# Patient Record
Sex: Male | Born: 1960 | Race: White | Hispanic: No | Marital: Single
Health system: Southern US, Community
[De-identification: ages and names within clinical notes are randomized; demographics above are authoritative.]

---

## 2004-03-30 ENCOUNTER — Ambulatory Visit (HOSPITAL_BASED_OUTPATIENT_CLINIC_OR_DEPARTMENT_OTHER): Admission: RE | Admit: 2004-03-30 | Discharge: 2004-03-30 | Payer: Self-pay | Admitting: Orthopedic Surgery

## 2005-01-30 ENCOUNTER — Emergency Department (HOSPITAL_COMMUNITY): Admission: EM | Admit: 2005-01-30 | Discharge: 2005-01-30 | Payer: Self-pay | Admitting: Emergency Medicine

## 2006-10-15 ENCOUNTER — Encounter: Admission: RE | Admit: 2006-10-15 | Discharge: 2006-10-15 | Payer: Self-pay | Admitting: Family Medicine

## 2006-11-19 ENCOUNTER — Emergency Department (HOSPITAL_COMMUNITY): Admission: EM | Admit: 2006-11-19 | Discharge: 2006-11-20 | Payer: Self-pay | Admitting: Emergency Medicine

## 2006-12-07 ENCOUNTER — Ambulatory Visit (HOSPITAL_COMMUNITY): Admission: RE | Admit: 2006-12-07 | Discharge: 2006-12-07 | Payer: Self-pay | Admitting: Orthopedic Surgery

## 2006-12-07 ENCOUNTER — Encounter: Payer: Self-pay | Admitting: Vascular Surgery

## 2006-12-07 ENCOUNTER — Ambulatory Visit: Payer: Self-pay | Admitting: Vascular Surgery

## 2006-12-27 ENCOUNTER — Ambulatory Visit (HOSPITAL_COMMUNITY): Admission: RE | Admit: 2006-12-27 | Discharge: 2006-12-27 | Payer: Self-pay | Admitting: Neurology

## 2007-02-21 ENCOUNTER — Ambulatory Visit (HOSPITAL_COMMUNITY): Admission: RE | Admit: 2007-02-21 | Discharge: 2007-02-21 | Payer: Self-pay | Admitting: *Deleted

## 2007-08-22 ENCOUNTER — Emergency Department (HOSPITAL_COMMUNITY): Admission: EM | Admit: 2007-08-22 | Discharge: 2007-08-22 | Payer: Self-pay | Admitting: Emergency Medicine

## 2010-12-12 NOTE — Cardiovascular Report (Signed)
Jerry Klein, Jerry Klein              ACCOUNT NO.:  [id number]   MEDICAL RECORD NO.:  [id number]          PATIENT TYPE:  OIB   LOCATION:  2899                         FACILITY:  [facility]   PHYSICIAN:  Darlin Priestly, MD  DATE OF BIRTH:  01-24-1961   DATE OF PROCEDURE:  02/21/2007  DATE OF DISCHARGE:                            CARDIAC CATHETERIZATION   PROCEDURE:  Heads-up tilt-table testing with Isuprel infusion.   ATTENDING PHYSICIAN:  Dr. Lenise Herald.   COMPLICATIONS:  None.   INDICATIONS FOR PROCEDURE:  [name] is a 50 year old male patient of  Dr. Lavonne Chick and Dr. Doran Clay as well as Dr. Amelia Jo with a  history of Down's syndrome who was referred to Dr. Elsie Lincoln for recurrent  pre-syncope/syncope and collapse.  He has undergone MRI revealing a  small vessel disease, but no further CNS abnormalities that we are aware  of.  Apparently his EEG was normal.  He is now referred for heads-up  tilt-table and to rule out possible neurocardiogenic etiology.   DESCRIPTION OF PROCEDURE:  After obtaining informed written consent from  Ignacia Marvel, [name]' mother, the patient is brought to the cardiac  cath lab in the  fasting state and placed in the supine position.  Hemodynamic measures are obtained.  A resting blood pressure of 134/86,  resting heart rate of 86.  He is monitored for approximately five  minutes.  He was then tilted to a 7-degree heads-up position where he  was maintained for 30 minutes.  He remained hemodynamically stable with  no symptoms, no significant changes in his heart rate or blood pressure.  Isuprel infusion was then begun at 0.5 mg and this was continued for  approximately five minutes.  He did increase in his heart rate up to  127, however, he had no significant change in his symptoms or blood  pressure.  His target heart rate was 96, which was obviously exceeded.  He remained asymptomatic.  Isuprel was then discontinued and he was  returned  back to a supine position.  He was monitored for approximately  five minutes and transferred back to the recovery room in stable  condition.   CONCLUSION:  Negative heads-up tilt-table testing with Isuprel infusion.      Darlin Priestly, MD  Electronically Signed     RHM/MEDQ  D:  02/21/2007  T:  02/21/2007  Job:  [id number]   cc:   Madaline Savage, M.D.  Al Decant. Janey Greaser, MD  Rene Kocher, M.D.

## 2010-12-12 NOTE — Procedures (Signed)
CLINICAL INFORMATION:  This is a 50 year old patient with Down syndrome  who complains of severe headaches, weakness and dizziness.   TECHNICAL DESCRIPTION:  This EEG was obtained during the awake state.  There is much artifact throughout this study including muscle artifact,  eye movement artifact, and eye rolling artifact.  The background  activity when able to be read was in the 7-8 Hz range.  No evidence of  stage II sleep was present but drowsiness was noted.  Photic stimulation  was performed which did not produce a drying response.  Hyperventilation  testing was not performed.   IMPRESSION:  This is a mildly abnormal EEG.  There is much artifact but  background activity does show some slowing into the 7-8 Hz range.  No  focal abnormalities are noted and this finding should be taken into  account of the clinical study of the patient and would be compatible to  toxic metabolic disorder, primary neuronal degenerative disorder,  medication side-effect, postictal state, etc. and should be taken  account this period at that point.           ______________________________  Genene Churn. Sandria Manly, M.D.     ZOX:WRUE  D:  12/27/2006 19:30:46  T:  12/28/2006 08:07:50  Job #:  [id number]   cc:   Rene Kocher, M.D.  Fax: [phone]

## 2010-12-15 NOTE — Op Note (Signed)
NAMEJETER, TOMEY                          ACCOUNT NO.:  [id number]   MEDICAL RECORD NO.:  [id number]                   PATIENT TYPE:  AMB   LOCATION:  DSC                                  FACILITY:  [facility]   PHYSICIAN:  Rodney A. Chaney Malling, M.D.           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   PROCEDURE:  1.  Dome osteotomy base of the first metatarsal, left foot.  2.  Release adductor tendon, great toe, left toe; exostosis, ________ to      great toe metacarpophalangeal joint and capsular advancement on medial      side of great toe.   SURGEON:  Lenard Galloway. Chaney Malling, M.D.   ANESTHESIA:  Local, followed by general.   DESCRIPTION OF PROCEDURE:  The patient was placed on the operating table in  the supine position with the pneumatic tourniquet about the left upper  thigh.  The left upper extremity was prepped with DuraPrep and draped out in  the usual manner.  The leg was wrapped up in an Esmarch and tourniquet was  elevated.  An incision was made over the exostosis over the great toe MP  joint.  Skin edges were retracted.  Great care was taken to avoid injury to  the superficial cutaneous nerves.  A densely based flap of the capsule was  incised and reflected distally.  This exposed a huge exostosis over the  medial border of the distal first metatarsal.  Using a power saw, the  exostosis was removed and saved for bone grafting later.  The capsule was  short and solid.  The toe was then realigned and the adductor tendon was  quite tight.  Incision was made between the first and second web space and  the adductor tendon released.  This allowed the toe to realign much better.  The  capsule of the medial border of the distal metatarsal was then closed  with 2-0 Vicryl and this realigned the great toe in a neutral position.  This was markedly improved.   Attention was turned to the base of the first metatarsal.  Incision was made  over the dorsal aspect.  Using the C-arm, the first metatarsal cuneiform  joint was identified and isolated.  Using a dome osteotomy saw, an osteotomy  was made through the base of the first metatarsal in the metaphyseal area.  Once this was accomplished, the severe metatarsus varus was realigned so the  first and second metatarsus were parallel.  In its new position, a heavy  ________ was passed through the metatarsal across the first cuneiform.  X-  rays were taken and it showed excellent realignment of the  first and second  metatarsal and these not parallel.  The bone graft which had been harvested  previously was placed around the osteotomy site.  The skin edges were then  closed with 3-0 nylon sutures.  Sterile dressings were applied and the  patient returned to the recovery room in excellent condition.  The procedure  technically went extremely well.  Drains none.  Complications none.  I was  very pleased with the technical result.                                               Rodney A. Chaney Malling, M.D.    RAM/MEDQ  D:  03/30/2004  T:  03/31/2004  Job:  [id number]

## 2011-03-17 ENCOUNTER — Emergency Department (HOSPITAL_COMMUNITY): Payer: Medicare Other

## 2011-03-17 ENCOUNTER — Emergency Department (HOSPITAL_COMMUNITY)
Admission: EM | Admit: 2011-03-17 | Discharge: 2011-03-17 | Disposition: A | Discharge status: Home / Self Care | Payer: Medicare Other | Attending: Emergency Medicine | Admitting: Emergency Medicine

## 2011-03-17 DIAGNOSIS — R569 Unspecified convulsions: Secondary | ICD-10-CM | POA: Insufficient documentation

## 2011-03-17 DIAGNOSIS — G309 Alzheimer's disease, unspecified: Secondary | ICD-10-CM | POA: Insufficient documentation

## 2011-03-17 DIAGNOSIS — Q909 Down syndrome, unspecified: Secondary | ICD-10-CM | POA: Insufficient documentation

## 2011-03-17 DIAGNOSIS — E039 Hypothyroidism, unspecified: Secondary | ICD-10-CM | POA: Insufficient documentation

## 2011-03-17 DIAGNOSIS — R404 Transient alteration of awareness: Secondary | ICD-10-CM | POA: Insufficient documentation

## 2011-03-17 DIAGNOSIS — F0280 Dementia in other diseases classified elsewhere without behavioral disturbance: Secondary | ICD-10-CM | POA: Insufficient documentation

## 2011-03-17 LAB — COMPREHENSIVE METABOLIC PANEL
ALT: 21 U/L (ref 0–53)
Albumin: 3.4 g/dL — ABNORMAL LOW (ref 3.5–5.2)
Alkaline Phosphatase: 91 U/L (ref 39–117)
CO2: 30 mEq/L (ref 19–32)
Calcium: 9.1 mg/dL (ref 8.4–10.5)
Creatinine, Ser: 1.14 mg/dL (ref 0.50–1.35)
GFR calc Af Amer: >60 mL/min (ref >60)
Glucose, Bld: 113 mg/dL — ABNORMAL HIGH (ref 70–99)
Total Bilirubin: 0.3 mg/dL (ref 0.3–1.2)
Total Protein: 7.2 g/dL (ref 6.0–8.3)

## 2011-03-17 LAB — CBC
MCV: 92.3 fL (ref 78.0–100.0)
RBC: 5.3 MIL/uL (ref 4.22–5.81)
RDW: 14.8 % (ref 11.5–15.5)
WBC: 7.3 10*3/uL (ref 4.0–10.5)

## 2011-03-17 LAB — DIFFERENTIAL
Basophils Absolute: 0 10*3/uL (ref 0.0–0.1)
Eosinophils Relative: 1 % (ref 0–5)
Lymphocytes Relative: 15 % (ref 12–46)
Monocytes Absolute: 0.4 10*3/uL (ref 0.1–1.0)

## 2011-04-19 LAB — URINALYSIS, ROUTINE W REFLEX MICROSCOPIC
Ketones, ur: NEGATIVE
Protein, ur: NEGATIVE

## 2011-04-19 LAB — COMPREHENSIVE METABOLIC PANEL
AST: 29
Albumin: 3.7
Alkaline Phosphatase: 68
CO2: 27
Calcium: 8.9
Chloride: 105
GFR calc Af Amer: >60
Potassium: 3.8
Total Bilirubin: 0.8

## 2011-04-19 LAB — CBC
Hemoglobin: 15.7
RBC: 4.81
WBC: 4.6

## 2011-04-19 LAB — TSH: TSH: 0.995

## 2011-04-19 LAB — DIFFERENTIAL
Basophils Relative: 1
Eosinophils Absolute: 0.1

## 2011-04-19 LAB — ACETAMINOPHEN LEVEL: Acetaminophen (Tylenol), Serum: <10 — ABNORMAL LOW

## 2011-04-19 LAB — RAPID URINE DRUG SCREEN, HOSP PERFORMED
Amphetamines: NOT DETECTED
Barbiturates: NOT DETECTED
Benzodiazepines: NOT DETECTED
Tetrahydrocannabinol: NOT DETECTED

## 2011-04-19 LAB — PROTIME-INR: INR: 0.9

## 2011-04-29 ENCOUNTER — Emergency Department (HOSPITAL_COMMUNITY): Payer: Medicare Other

## 2011-04-29 ENCOUNTER — Inpatient Hospital Stay (HOSPITAL_COMMUNITY)
Admission: EM | Admit: 2011-04-29 | Discharge: 2011-05-01 | DRG: 101 | Disposition: A | Discharge status: Home / Self Care | Payer: Medicare Other | Attending: Internal Medicine | Admitting: Internal Medicine

## 2011-04-29 ENCOUNTER — Emergency Department (HOSPITAL_COMMUNITY)
Admission: EM | Admit: 2011-04-29 | Discharge: 2011-04-29 | Disposition: A | Discharge status: Home / Self Care | Payer: Medicare Other | Attending: Emergency Medicine | Admitting: Emergency Medicine

## 2011-04-29 DIAGNOSIS — G20A1 Parkinson's disease without dyskinesia, without mention of fluctuations: Secondary | ICD-10-CM | POA: Diagnosis present

## 2011-04-29 DIAGNOSIS — G20 Parkinson's disease: Secondary | ICD-10-CM | POA: Diagnosis present

## 2011-04-29 DIAGNOSIS — Q909 Down syndrome, unspecified: Secondary | ICD-10-CM | POA: Insufficient documentation

## 2011-04-29 DIAGNOSIS — Z79899 Other long term (current) drug therapy: Secondary | ICD-10-CM

## 2011-04-29 DIAGNOSIS — E039 Hypothyroidism, unspecified: Secondary | ICD-10-CM | POA: Insufficient documentation

## 2011-04-29 DIAGNOSIS — R569 Unspecified convulsions: Secondary | ICD-10-CM | POA: Insufficient documentation

## 2011-04-29 DIAGNOSIS — G309 Alzheimer's disease, unspecified: Secondary | ICD-10-CM | POA: Diagnosis present

## 2011-04-29 DIAGNOSIS — M5030 Other cervical disc degeneration, unspecified cervical region: Secondary | ICD-10-CM | POA: Insufficient documentation

## 2011-04-29 DIAGNOSIS — F0280 Dementia in other diseases classified elsewhere without behavioral disturbance: Secondary | ICD-10-CM | POA: Diagnosis present

## 2011-04-29 DIAGNOSIS — M542 Cervicalgia: Secondary | ICD-10-CM | POA: Insufficient documentation

## 2011-04-29 DIAGNOSIS — G40909 Epilepsy, unspecified, not intractable, without status epilepticus: Principal | ICD-10-CM | POA: Diagnosis present

## 2011-04-29 DIAGNOSIS — G4700 Insomnia, unspecified: Secondary | ICD-10-CM | POA: Diagnosis present

## 2011-04-29 LAB — DIFFERENTIAL
Basophils Absolute: 0 10*3/uL (ref 0.0–0.1)
Eosinophils Absolute: 0.1 10*3/uL (ref 0.0–0.7)
Eosinophils Relative: 2 % (ref 0–5)
Lymphocytes Relative: 26 % (ref 12–46)
Lymphs Abs: 2 10*3/uL (ref 0.7–4.0)
Lymphs Abs: 1.5 10*3/uL (ref 0.7–4.0)
Monocytes Absolute: 0.8 10*3/uL (ref 0.1–1.0)
Neutro Abs: 3.9 10*3/uL (ref 1.7–7.7)
Neutrophils Relative %: 61 % (ref 43–77)

## 2011-04-29 LAB — CBC
HCT: 43.5 % (ref 39.0–52.0)
Hemoglobin: 15.2 g/dL (ref 13.0–17.0)
MCH: 31.7 pg (ref 26.0–34.0)
MCHC: 33.5 g/dL (ref 30.0–36.0)
MCV: 91.8 fL (ref 78.0–100.0)
RBC: 4.74 MIL/uL (ref 4.22–5.81)
WBC: 6 10*3/uL (ref 4.0–10.5)

## 2011-04-29 LAB — COMPREHENSIVE METABOLIC PANEL
BUN: 17 mg/dL (ref 6–23)
Calcium: 8.9 mg/dL (ref 8.4–10.5)
GFR calc Af Amer: >60 mL/min (ref >60)
Glucose, Bld: 97 mg/dL (ref 70–99)
Sodium: 141 mEq/L (ref 135–145)
Total Protein: 7 g/dL (ref 6.0–8.3)

## 2011-04-29 LAB — BASIC METABOLIC PANEL
BUN: 14 mg/dL (ref 6–23)
CO2: 23 mEq/L (ref 19–32)
Chloride: 106 mEq/L (ref 96–112)
Creatinine, Ser: 0.94 mg/dL (ref 0.50–1.35)

## 2011-04-29 NOTE — H&P (Signed)
NAMEPREM, COYKENDALL NO.:  [id number]  MEDICAL RECORD NO.:  [id number]  LOCATION:  [facility]                         FACILITY:  [facility]  PHYSICIAN:  Andreas Blower, MD       DATE OF BIRTH:  [birth year]  DATE OF ADMISSION:  04/29/2011 DATE OF DISCHARGE:                             HISTORY & PHYSICAL   PRIMARY CARE PHYSICIAN:  Thayer Headings, MD  PRIMARY NEUROLOGIST:  Joycelyn Schmid, MD  CHIEF COMPLAINT:  Seizure-like activity.  HISTORY OF PRESENT ILLNESS:  [name] is a 50 year old Caucasian male with a history of Down syndrome, hypothyroidism, Alzheimer's disease who presents with the above complaints.  The patient is not able to provide history.  The patient's brother was able to provide most of the history. The patient has had these episodes of small seizures what the brother described as jerking-like movements for about 4-5 seconds.  The patient in the process had been seeing Dr. Marjory Lies with Neurology and had a EEG done on April 26, 2011, the results are still pending.  However, yesterday, April 28, 2011, the patient had an episode of seizure with decreased mentation, as a result presented to the [facility] yesterday.  The patient was evaluated by Dr. Lendell Caprice with Neurology, who recommended sublingual ativan and having the patient followup with Dr. Marjory Lies with Neurology as an outpatient.  After being discharged this morning from the emergency department. Around 12:30, the patient had another episode of seizure.  This one lasting for a few minutes and had another seizure at 1:24 p.m. with labored breathing that lasted for 7-18 minutes with decreased responsiveness, as a result was brought into the ER for further evaluation by the EMS.  Per family, the patient has not had any recent fevers, chills, nausea, or vomiting, has not had any chest pain or shortness of breath, has not had any abdominal pain, diarrhea, headaches, or vision  changes.  REVIEW OF SYSTEMS:  All systems were reviewed with the patient was positive as per HPI, otherwise all other systems are negative.  PAST MEDICAL HISTORY: 1. History of Alzheimer's disease. 2. History of arthritis. 3. History of Down syndrome 4. Hypothyroidism. 5. Questionable history of Parkinson's disease. 6. History of insomnia, was on Lunesta which has been held for the     last 3 days.  SOCIAL HISTORY:  The patient lives with his brother and mother.  Does not drink any alcohol and does not smoke.  FAMILY HISTORY:  Mother has diabetes and grandparents have diabetes.  HOME MEDICATIONS: 1. Levothyroxine 50 mcg p.o. q.a.m. 2. Lunesta 3 mg p.o. daily at bedtime.  Has not been taking any of these medications over the last 3 days.  PHYSICAL EXAM:  VITAL SIGNS:  Temperature is afebrile, blood pressure is 158/103, heart rate 89, respirations 18, satting at 98% on 3 liters of oxygen. GENERAL:  The patient was awake, no acute distress.  Answered simple questions and family answered more complicated questions.  Was lying in bed comfortably. HEENT:  Extraocular motions are intact.  Pupils equal and round and moist mucous membranes. NECK:  Supple. HEART:  Regular with S1 and S2. LUNGS:  Clear to auscultation bilaterally. ABDOMEN:  Soft, nontender, nondistended.  Positive bowel sounds. NEURO:  Cranial nerves grossly intact.  Had 5/5 motor strength in upper as well as lower extremities. NEUROLOGIC:  Limited due to the patient's compliance.  RADIOLOGY/IMAGING:  The patient had CT of the C-spine, which showed exam degraded by motion artifact.  No definite acute fracture was identified. Cervical kyphosis centered at old mild wedge compression deformity of C5 vertebral body.  Degenerative cervical spondylosis.  LABORATORY DATA:  CBC shows white count of 6.0, hemoglobin 14.8, hematocrit 43.5, platelet count 203.  Electrolytes normal with a BUN of 14, creatinine 0.94.  Liver  function tests normal except albumin 3.2.  ASSESSMENT/PLAN: 1. Seizure.  Etiology unclear at this time.  Uncertain if the patient     is truly having seizures versus myoclonic jerks. Neurology has been     consulted by the ER physician.  Dr. Hoy Morn with Neurology has been     notified.  Currently, the patient is started on empiric Keppra.  We     will defer to Neurology whether they would like to get an MRI of     his brain.  We will also defer to Neurology whether another EEG     should be done, as per family EEG has been done most recently on     April 26, 2011, with results unavilable to me.  We will get     a head CT to evaluate for any acute intracranial process that     could account for his seizure-like activity. 2. Hypothyroidism.  Continue the patient on Synthroid. 3. History of old wedge compression deformity of C5 vertebral body.     The patient does not have any significant back pain conservative     management for now. 4. History of Down syndrome, stable. 5. History of Alzheimer's disease.  Further management as per     outpatient. 6. Insomnia.  Hold Lunesta.  Monitor for now. 7. Prophylaxis.  Lovenox for DVT prophylaxis. 8. Code status.  The patient is full code.  The patient's brother     indicated that he is not willing to make any decisions at this     time until he talks to his mother.  Will have the rounding     physician address this in the morning.  Time spent on admission talking to the patient and family including care was 50 minutes.   Andreas Blower, MD   SR/MEDQ  D:  04/29/2011  T:  04/29/2011  Job:  [id number]  Electronically Signed by Wardell Heath Lindi Abram  on 04/29/2011 05:41:21 PM

## 2011-04-30 ENCOUNTER — Inpatient Hospital Stay (HOSPITAL_COMMUNITY): Payer: Medicare Other

## 2011-04-30 LAB — BASIC METABOLIC PANEL
BUN: 11 mg/dL (ref 6–23)
Chloride: 104 mEq/L (ref 96–112)
Creatinine, Ser: 1.01 mg/dL (ref 0.50–1.35)
GFR calc Af Amer: >90 mL/min (ref >90)
Glucose, Bld: 96 mg/dL (ref 70–99)

## 2011-04-30 LAB — CBC
HCT: 41.7 % (ref 39.0–52.0)
MCHC: 33.1 g/dL (ref 30.0–36.0)
MCV: 93.3 fL (ref 78.0–100.0)
RDW: 15.2 % (ref 11.5–15.5)
WBC: 7.7 10*3/uL (ref 4.0–10.5)

## 2011-04-30 NOTE — Consult Note (Signed)
Jerry Klein, Jerry Klein NO.:  [id number]  MEDICAL RECORD NO.:  [id number]  LOCATION:  [facility]                         FACILITY:  [facility]  PHYSICIAN:  Kipp Laurence, MD DATE OF BIRTH:  1961/04/26  DATE OF CONSULTATION:  04/29/2011 DATE OF DISCHARGE:  04/29/2011                                CONSULTATION   Time of Consultation:  4:45 a.m.  CONSULTING PHYSICIAN:  Dr. Kennon Rounds, MD.  CHIEF COMPLAINT:  Jerking movements.  HISTORY OF PRESENT ILLNESS:  Jerry Klein is a 50 year old white man with history of Down syndrome, Alzheimer's disease, Parkinson's disease, and a new-onset generalized tonic-clonic seizure in August of 2012, who presents to the emergency department with worsening of his recent jerking episodes.  He is accompanied by his mother and brother, who provide all of his history.  His mom reports that for the past 6 to 12 months the patient has developed episodes of full body jerking lasting seconds at a time and often in isolation.  These episodes frequently cluster primarily in the morning time, although they can occur at any time during the day.  During these, he has no change in his mental status and is able to remain alert.  If they occur while he is walking, he will briefly trip but has never fallen with them. The patient walks with assistance at baseline.  These episodes have increased in frequency since they began 6 to 12 months ago and now occur almost every day multiple times throughout the day.  The patient presented in August of this year with a first time generalized tonic- clonic seizure and underwent a CT of his head at that time at [facility] which was unrevealing.  Since then, he has been evaluated outpatient with Dr. Marjory Lies at [facility]  Neurology for evaluation of these jerking episodes.  The patient underwent an EEG at Dr. Richrd Humbles office earlier this week; however, family is not aware of the results yet and has not seen  him yet in followup.  The patient is not currently nor has he been initiated on antiepileptic therapy and he has had no further generalized tonic-clonic events.  His mother reports he has no history of seizures even at the young age.  She denies any recent medication changes, toxic exposures, or head or neck trauma.  Mother does report that the patient has severe nighttime snoring and intermittent episodes of apnea while sleeping that are concerning for undiagnosed obstructive sleep apnea.  PAST MEDICAL HISTORY: 1. Down syndrome. 2. Alzheimer's disease. 3. Hypothyroidism. 4. Parkinson's disease.  MEDICATIONS: 1. Synthroid unknown dose daily. 2. Lunesta nightly as needed.  ALLERGIES:  ARICEPT, unknown reaction.  SOCIAL HISTORY:  The patient lives with his mother and is reliant upon her for all of his ADLs.  He has no tobacco, alcohol, or drug use history.  FAMILY HISTORY:  No family history of seizures or neurologic disease.  REVIEW OF SYSTEMS:  Review of systems was unable to be obtained from the patient secondary to his baseline mental status; however, his mother and brother report he has had no other recent symptoms except those as noted in the HPI.  PHYSICAL EXAMINATION:  VITAL SIGNS:  Blood  pressure 100/73, heart rate 49, respiratory rate 16, pulse ox 96%. GENERAL:  No acute distress.  The patient is pleasant and lying in bed. CARDIOVASCULAR:  Regular rate and rhythm.  No murmurs, gallops, or rubs. No carotid bruits auscultated. PULMONARY:  Clear to auscultation bilaterally. NEUROLOGIC:  Mental status: the patient is alert and oriented to self only.  Vocabulary is quite limited; however, speech is mildly dysarthric at baseline. Fund of knowledge is poor which is also at baseline.  Cranial nerves: II-  Visual fields intact to blink-to-threat. III, IV, VI- Extraocular movements intact.  Pupils equal, round, reactive to light and accommodation.  V- Facial sensation is  intact bilaterally.  No weakness of mastication muscles.  VII- no facial weakness or asymmetry. VIII-  Auditory acuity is intact bilaterally.  IX, X-  Uvula is midline, palate elevates symmetrically. XI- 5/5 strength in bilateral sternocleidomastoid and trapezius.  XII-  Tongue is midline, and does not deviate. MOTOR:  The patient is unable to cooperate with extensive motor testing; however, he appears to have full and symmetric strength in bilateral upper extremities.  He has very poor effort in lower extremities and may have somewhat limited in strength there; however, it is again symmetric. Family reports that he does not walk on his own at baseline. SENSORY:  The patient responds to light touch throughout. REFLEXES:  3+ deep tendon reflexes throughout, right greater than left Hoffmann's is present, absent Babinski bilaterally. COORDINATION:  The patient is unable to follow the testing. GAIT:  Gait testing is deferred.  The patient has limited ambulation at baseline.  OTHER:  The patient demonstrates typical facial characteristics and features of  Down syndrome.  LABORATORY DATA:  BMP, LFTs and CBC were within normal limits today.  IMAGING:  CT of the head without contrast was obtained on March 17, 2011.  This demonstrates severe atrophy and some chronic small vessel disease; however, no acute intracranial abnormality was noted.  ASSESSMENT:  This is a 50 year old white man with Down syndrome, Alzheimer's disease, Parkinson's disease, and new-onset one-time generalized, tonic-clonic seizure in August 2012 presenting with a 6 to 12 months progressive history of movements that in description are consistent with myoclonus.  This could be epileptic  myoclonus; however, due to his late age of onset, it seems unlikely. Secondary myoclonus would be more consistent with his history.  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 LFTs are normal.  CT of the head does not demonstrate any intracranial abnormality which would explain these symptoms.  This could also be secondary to hypercarbia from possible sleep apnea which is currently undiagnosed or due to a high cervical structural lesion.  PLAN: 1. Recommend CT of the C-spine without contrast to rule out cervical     compression. 2. We will refer to Dr. Marjory Lies, his outpatient neurologist, to determine if      OSA assessment is warranted.  Even if the patient's screen is positive     sleep apnea, he is unlikely to tolerate CPAP based on mental     status. 3. Consider sending family home with sublingual Ativan tablets 1 mg     with instructions that they could administer these for a     generalized tonic-clonic seizure lasting 5 minutes or longer.  I     discussed the use of this treatment with the family and that they     should not attempt  to treat his myoclonic spells with this.  We     also discussed seizure precautions and seizure first aid.  I     advised the patient's family that, if he should receive a dose of     his Ativan, EMS should be called to come assess him. 4. Family is instructed to call Dr. Richrd Humbles office on Monday to     try to setup a followup and to review his EEG findings and discuss     further workup or treatment.  Thank you for this consultation.        ______________________________ Kipp Laurence, MD     ES/MEDQ  D:  04/29/2011  T:  04/29/2011  Job:  [id number]  Electronically Signed by Kipp Laurence MD on 04/30/2011 09:48:28 AM

## 2011-05-01 NOTE — Procedures (Signed)
EEG NUMBER:  REFERRING PHYSICIAN:  Dr. Betti Cruz.  HISTORY:  A 50 year old male admitted with seizures.  MEDICATIONS:  Ativan, Lovenox, Synthroid, Levaquin, Keppra.  CONDITIONS OF RECORDING:  This is a 16-channel EEG carried out patient in the awake and drowsy states.  DESCRIPTION:  The background activity despite state consists of a 3-4 Hz delta rhythm that is distributed over both hemispheres and is fairly symmetric.  It is of higher voltage in the frontal regions than it is in the temporal or occipital regions.  This rhythm is fairly persistent throughout the tracing.  On occasion, faster rhythms are noted at that region, alpha rhythms, but these are poorly sustained.  This activity is sustained despite the state of the patient whether he is noted to be awake or drowsy.  Hypoventilation was not performed.  Intermittent photic stimulation failed to elicit any change in the tracing.  IMPRESSION:  This is an abnormal EEG secondary to general background slowing.  This finding is consistent with diffuse disturbance that is etiologically nonspecific, but may include a metabolic encephalopathy among other possibilities.  No epileptiform activity is noted.          ______________________________ Thana Farr, MD    ZO:XWRU D:  04/30/2011 17:58:52  T:  04/30/2011 23:43:25  Job #:  [id number]

## 2011-05-07 ENCOUNTER — Other Ambulatory Visit: Payer: Self-pay | Admitting: Internal Medicine

## 2011-05-07 DIAGNOSIS — R609 Edema, unspecified: Secondary | ICD-10-CM

## 2011-05-08 ENCOUNTER — Ambulatory Visit
Admission: RE | Admit: 2011-05-08 | Discharge: 2011-05-08 | Disposition: A | Discharge status: Home / Self Care | Payer: Medicare Other | Source: Ambulatory Visit | Attending: Internal Medicine | Admitting: Internal Medicine

## 2011-05-08 ENCOUNTER — Other Ambulatory Visit: Payer: PRIVATE HEALTH INSURANCE

## 2011-05-08 DIAGNOSIS — R609 Edema, unspecified: Secondary | ICD-10-CM

## 2011-05-10 NOTE — Discharge Summary (Signed)
NAMEBANDY, HONAKER              ACCOUNT NO.:  [id number]  MEDICAL RECORD NO.:  [id number]  LOCATION:  5524                         FACILITY:  [facility]  PHYSICIAN:  Rosanna Randy, MDDATE OF BIRTH:  Dec 04, 1960  DATE OF ADMISSION:  04/29/2011 DATE OF DISCHARGE:  05/01/2011                              DISCHARGE SUMMARY   DISCHARGE DIAGNOSES: 1. New onset of seizure disorders. 2. History of Alzheimer disease. 3. History of Down syndrome. 4. History of hypothyroidism. 5. History of arthritis. 6. Questionable history of Parkinson disease. 7. History of insomnia.  DISCHARGE MEDICATIONS: 1. Tylenol 650 mg every 4 hours as needed for pain. 2. Keppra oral solution 100 mg/mL in order to take 5 mL by mouth twice     a day. 3. Levothyroxine 50 mcg 1 tablet by mouth every morning. 4. Lunesta 3 mg 1 tablet by mouth p.r.n. at bedtime for insomnia.  DISPOSITION AND FOLLOWUP:  The patient has been discharged in a stable and improved condition without having any further seizures or jerky movements over the last 48 hours after being taken care for.  The patient is going to arrange followup over the next 7-10 days with Dr. Marjory Lies, primary neurologist, as an outpatient for further adjustment and long-term decision on his anticonvulsant medications.  Instructions have been also given to arrange followup in 2 weeks with primary care physician in order to look at his chronic medical problems and to continue receiving further care.  PROCEDURES PERFORMED DURING THIS HOSPITALIZATION:  The patient had a CT of the head without contrast that demonstrated a stable age advanced cerebral atrophy and ventriculomegaly.  No acute intracranial findings or mass lesions.  He also had a CT of the cervical spine without contrast that demonstrated degraded exam by the patient's motion artifact, but without any definite acute fracture identified.  There was cervical kyphosis center at all mild wedge  compression deformity of C5 vertebral body.  Grade 1 degenerative anterolisthesis also seen at C4- C5, measuring 4 mm.  Degenerative cervical spondylosis most severe in the lower cervical spine.  No other acute findings.  The patient also had electroencephalogram done that demonstrated a normal EEG secondary to general background slowing.  No epileptiform activity was noted.  No other procedures were performed during this hospitalization.  Neurology was consulted during this admission.  HISTORY OF PRESENT ILLNESS:  For full details, refer to dictation done by Dr. Andreas Blower on April 29, 2011, but briefly, Mr. Woo is a 50 year old Caucasian male with a history of Down syndrome, hypothyroidism, and Alzheimer disease, who presents with seizure like activity to the emergency department.  The patient was unable to provide any history due to his mentation being abnormal at baseline and difficulty communicating.  The patient's brother was able to provide most of the history.  The patient had this episode of small seizures when the brother described as jerking like moment for about 4-5 seconds. The patient, in the process, had been seeing Dr. Marjory Lies with Neurology and had a EEG done on April 26, 2011.  The results were still pending.  However, on April 28, 2011, the patient had an episode of seizure with decreased mentation, and as a  result presented to the [facility] yesterday.  The patient was evaluated by Dr. Lendell Caprice with Neurology who recommended sublingual Ativan and having the patient follow with Dr. Marjory Lies as an outpatient.  After being discharged on April 29, 2011, around 12:30, the patient had another episode of seizure, this one lasted for a few minutes and had another seizure at 1:24 p.m. with a labor breathing that lasted 7-18 minutes with a decreased responsiveness, as a result the patient was brought into the ER for further evaluation by EMS.   [facility] was called to admit the patient for further evaluation and treatment.  No signs of infections, diarrhea, chest pain, shortness of breath, fever, or any focal deficits were appreciated.  PERTINENT LABORATORY DATA:  CBC with differential demonstrating a white blood cells of 6.0, hemoglobin 14.8, platelets 203.  BMET with a sodium of 136, potassium 5.0 with mild hemolysis, chloride 106, bicarb 23, glucose 121, BUN 14, creatinine 0.94.  TSH 8.03.  Vitamin B12 407.  HOSPITAL COURSE BY PROBLEM: 1. Seizure activity, new onset, that responded pretty well to the use     of Keppra.  Per Neurology's recommendation during this admission,     the patient has been discharged on Keppra twice a day 500 mg and is     going to follow up with Dr. Marjory Lies for further evaluation and     treatment of his seizure disorders. 2. Hypothyroidism with TSH of 8.03 which is mildly elevated.  The     patient with a history of hypothyroidism.  This level mildly     abnormal based on his seizure activity.  At this point, we     recommend for primary care physician to repeat TSH and also free T4     and adjust his levothyroxine as needed.  The patient received     information about this and also I spoke with the patient's father     about this.  For now, we are going to continue levothyroxine 50     mcg. 3. Down syndrome.  We are going to continue supportive care. 4. Osteoarthritis.  We will continue using p.r.n. Tylenol. 5. History of insomnia.  The patient had been discharged on his     Lunesta as needed in order to help him for sleep.  DISCHARGE PHYSICAL EXAMINATION:  VITAL SIGNS:  Demonstrated a temperature of 98.5, heart rate 68, respiratory rate 20, blood pressure 110/56, oxygen saturation 99% on room air. GENERAL:  The patient was in no acute distress. RESPIRATORY SYSTEM:  Clear to auscultation bilaterally. HEART:  Regular rate and rhythm.  No murmurs, gallops, or rubs. ABDOMEN:  Soft,  nontender with positive bowel sounds. EXTREMITIES:  Without edema, cyanosis, or clubbing. NEUROLOGIC:  Without focal deficits.     Rosanna Randy, MD     CEM/MEDQ  D:  05/01/2011  T:  05/01/2011  Job:  [id number]  cc:   Thayer Headings, M.D. Joycelyn Schmid, MD  Electronically Signed by Vassie Loll MD on 05/10/2011 08:04:00 AM

## 2012-04-11 IMAGING — CT CT HEAD W/O CM
2 series · 16 of 30 positions shown, 20 images · non-contrast
Comparison: None.

CLINICAL DATA: Seizures

CT HEAD WITHOUT CONTRAST
TECHNIQUE: Contiguous axial images were obtained from the base of
the skull through the vertex without contrast.

[Series 2: head w/o · axial · non-contrast · 0.46mm/px · z∈[+95,+215]mm · 13 of 30 slices shown, 17 images]
[im 3/30  brain]
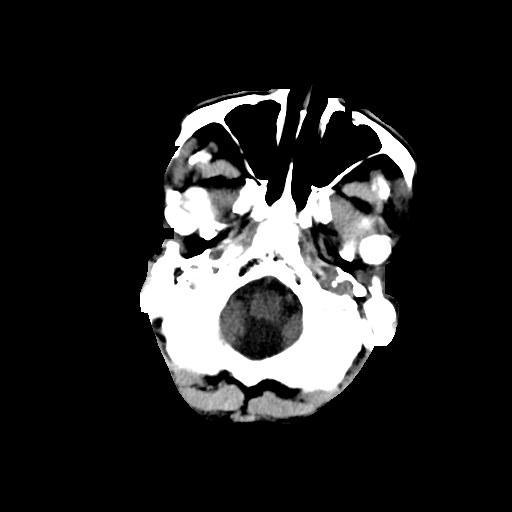
[im 3/30  bone]
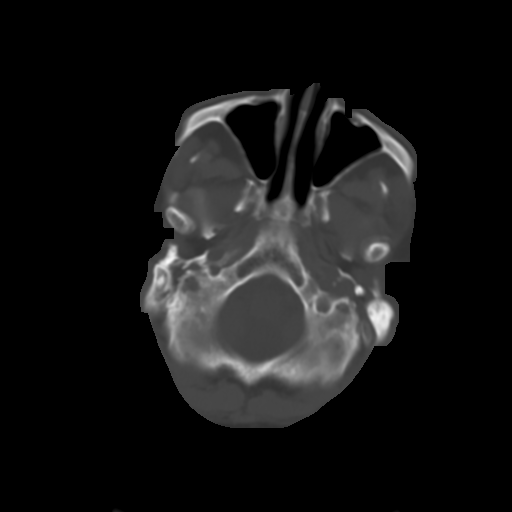
[im 5/30  brain]
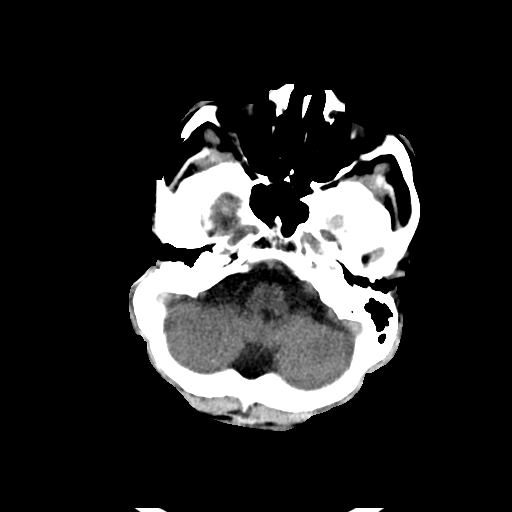
[im 7/30  brain]
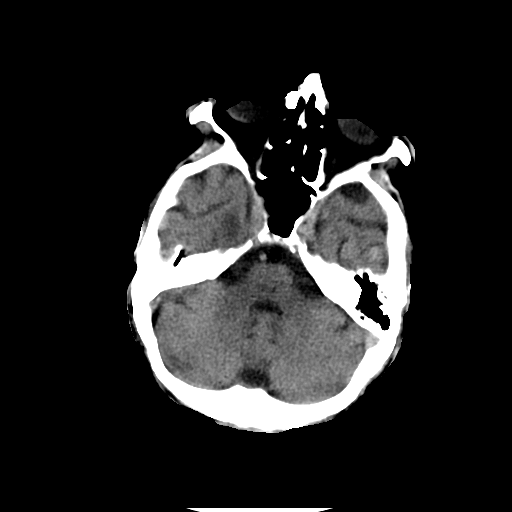
[im 9/30  brain]
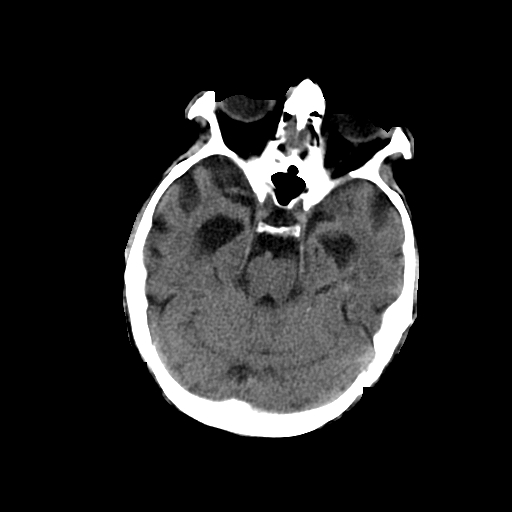
[im 11/30  brain]
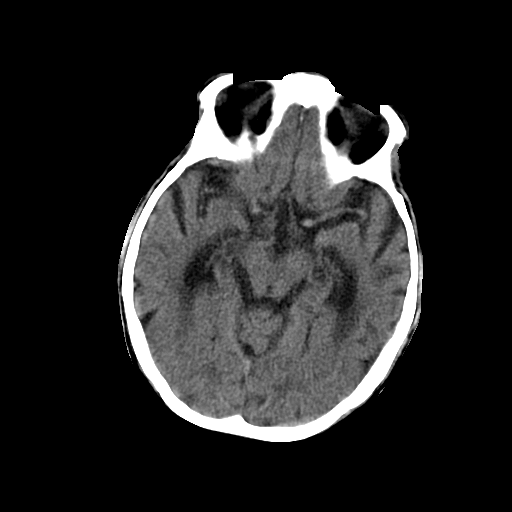
[im 11/30  bone]
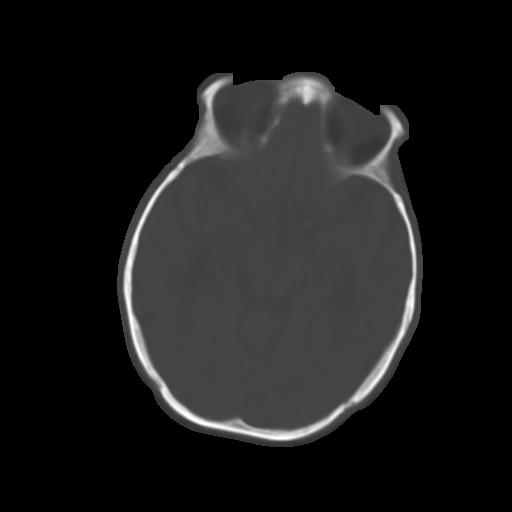
[im 13/30  brain]
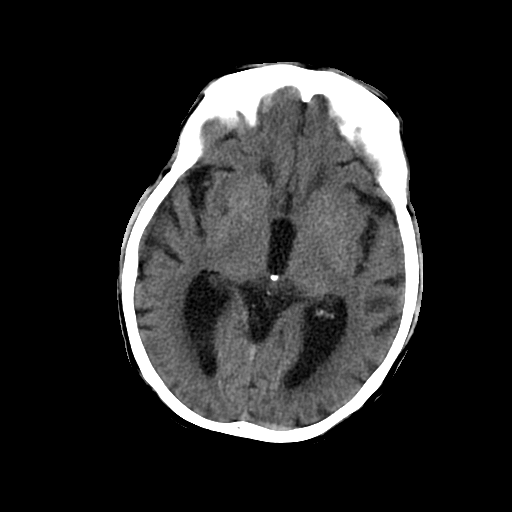
[im 15/30  brain]
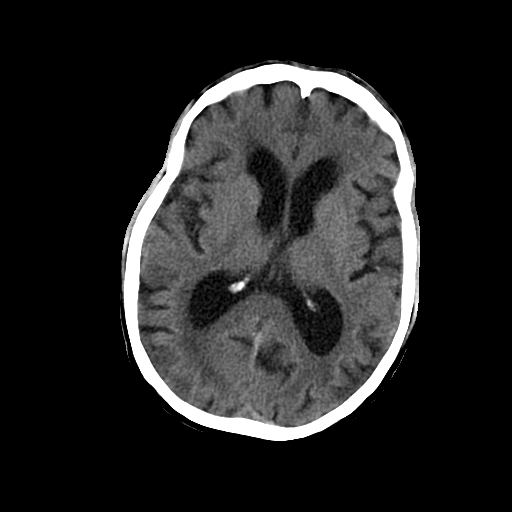
[im 17/30  brain]
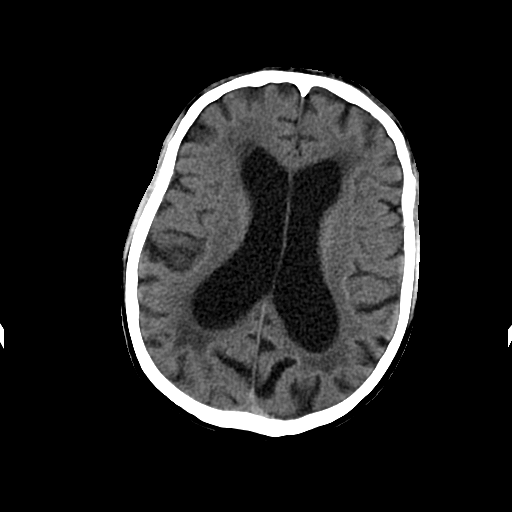
[im 19/30  brain]
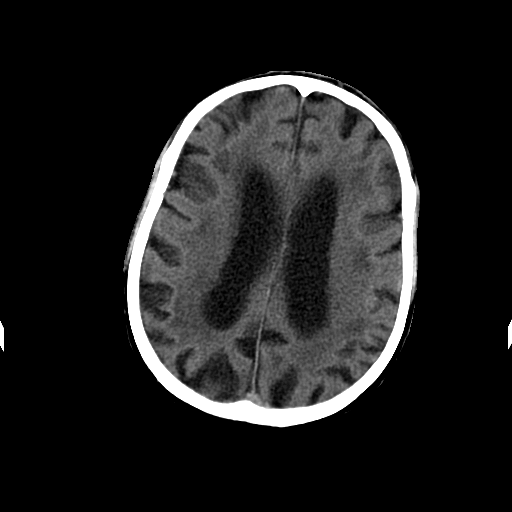
[im 19/30  bone]
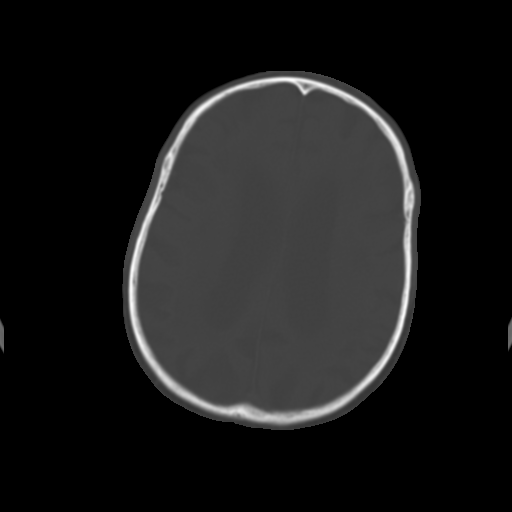
[im 21/30  brain]
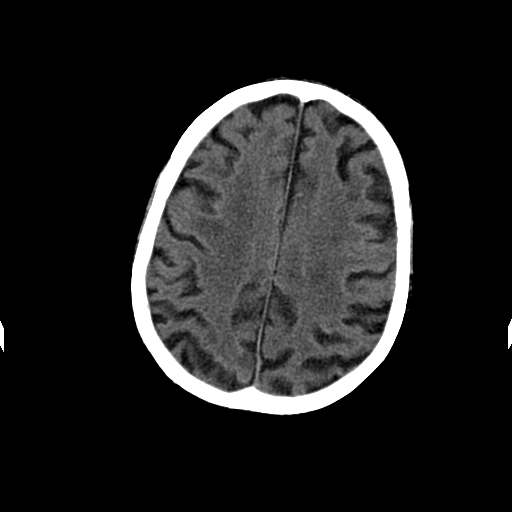
[im 23/30  brain]
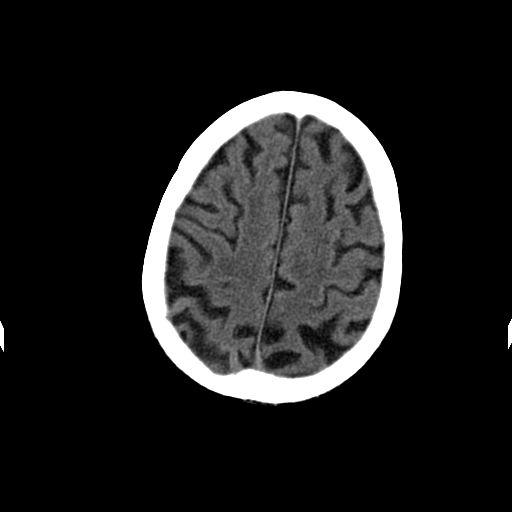
[im 25/30  brain]
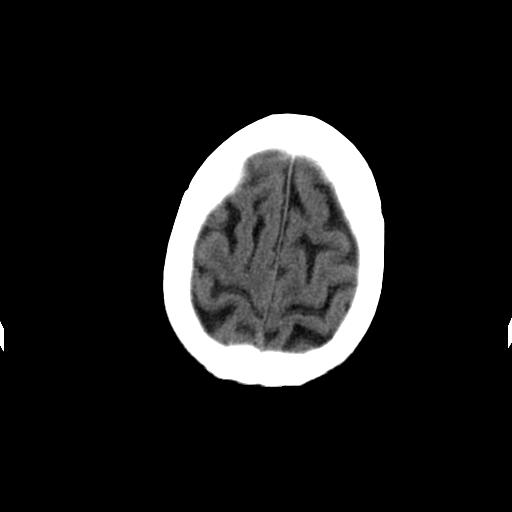
[im 27/30  brain]
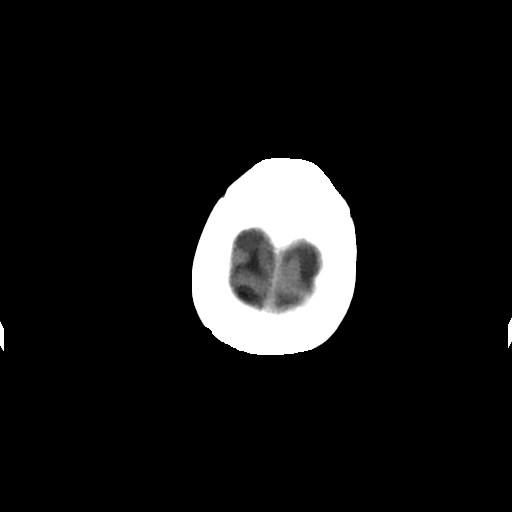
[im 27/30  bone]
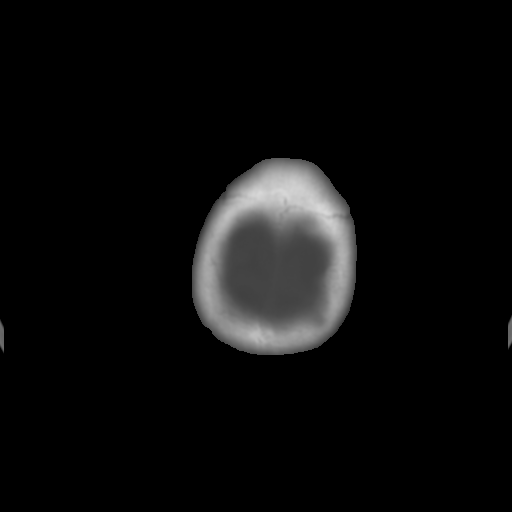

[Series 3: head w/o bone · axial · non-contrast · 0.46mm/px · z∈[+95,+135]mm · 3 of 30 slices shown]
[im 3/30  bone]
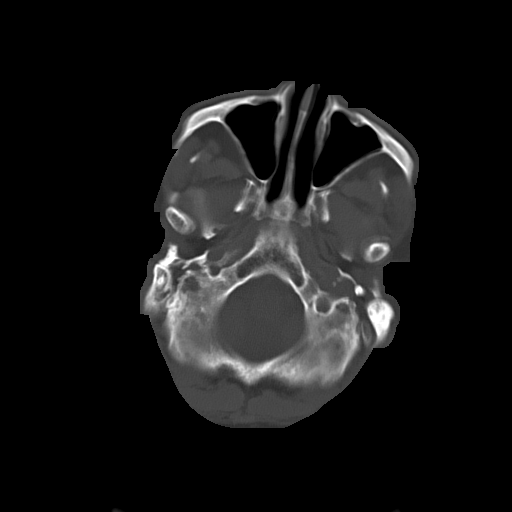
[im 7/30  bone]
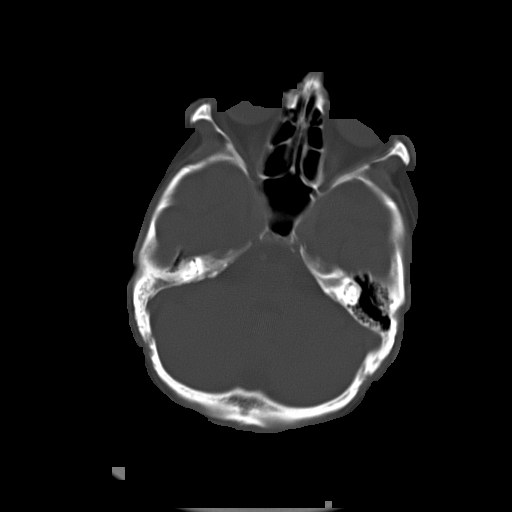
[im 11/30  bone]
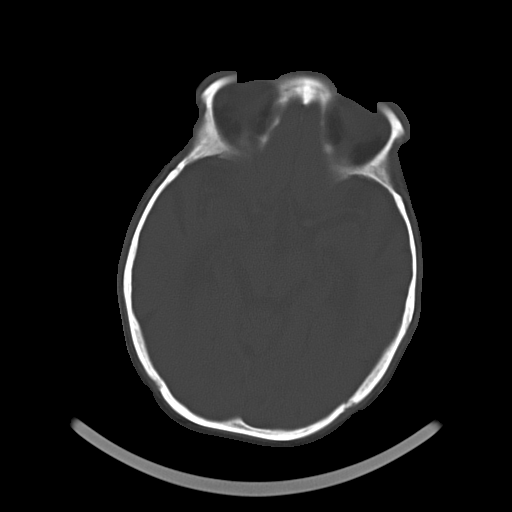

[16 of 30 positions shown; findings below may reference images not displayed]

FINDINGS: Cortical volume loss noted with proportional ventricular
prominence.  Periventricular white matter hypodensity likely
indicates small vessel ischemic change.  No acute hemorrhage, acute
infarction, or mass lesion is identified.  Right external capsule
CSF density probable remote lacunar infarct noted image 12.  Patchy
areas of white matter hypodensity likely indicate small vessel
ischemic change. Examination is degraded by patient motion.  Orbits
and paranasal sinuses are intact.
IMPRESSION: No acute intracranial finding.  Chronic findings as above.

## 2012-06-02 IMAGING — US US EXTREM LOW VENOUS*R*
1 series · 14 of 24 positions shown · non-contrast
Comparison: None.

CLINICAL DATA: Right leg swelling, history of DVT

RIGHT LOWER EXTREMITY VENOUS DUPLEX ULTRASOUND
TECHNIQUE: Gray-scale sonography with graded compression, as well
as color Doppler and duplex ultrasound, were performed to evaluate
the deep venous system of the lower extremity from the level of the
common femoral vein through the popliteal and proximal calf veins.
Spectral Doppler was utilized to evaluate flow at rest and with
distal augmentation maneuvers.

[Series 1: us extrem low venous*right* · 14 of 27 slices shown]
[im 1/27]
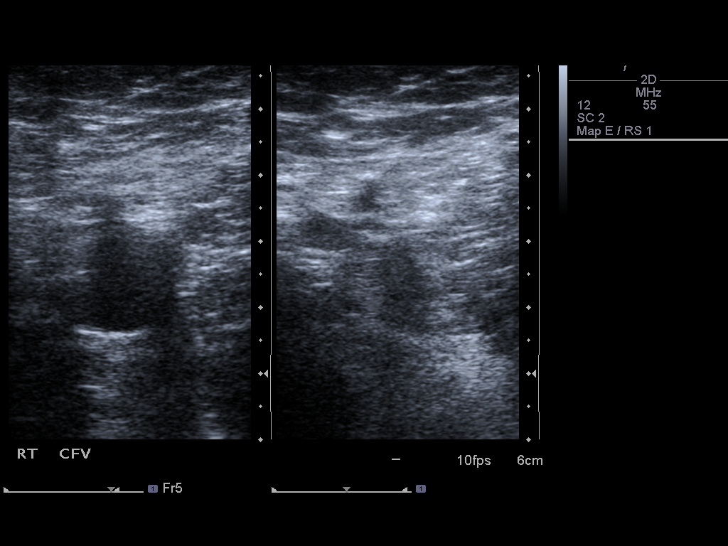
[im 3/27]
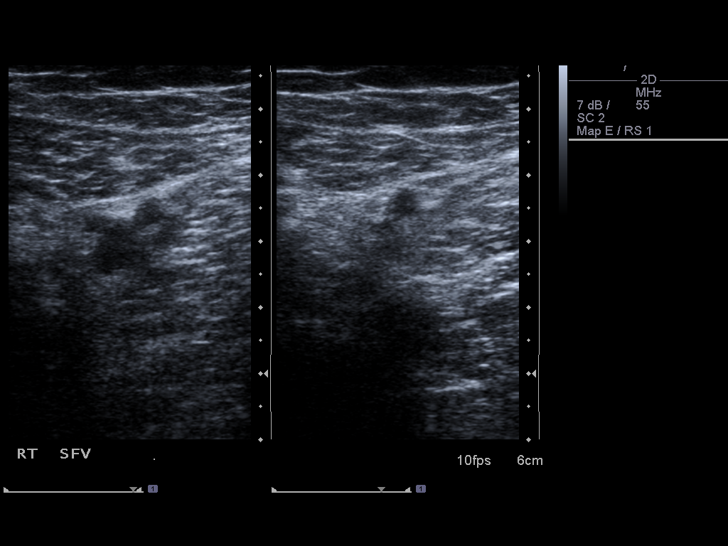
[im 5/27]
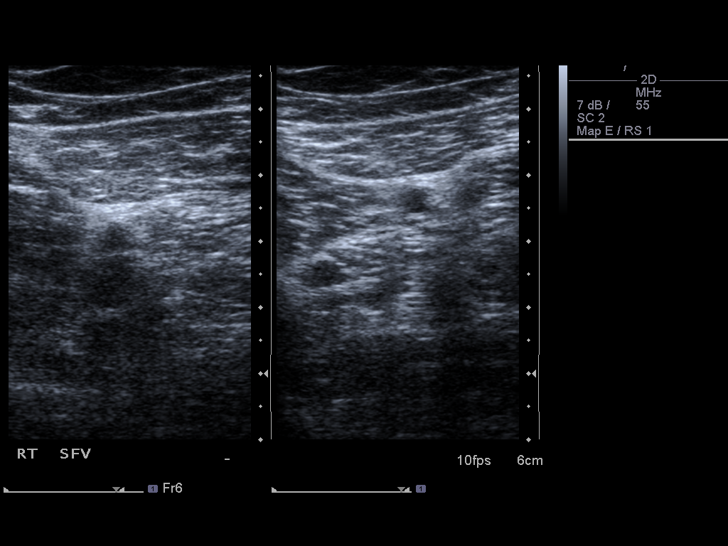
[im 7/27]
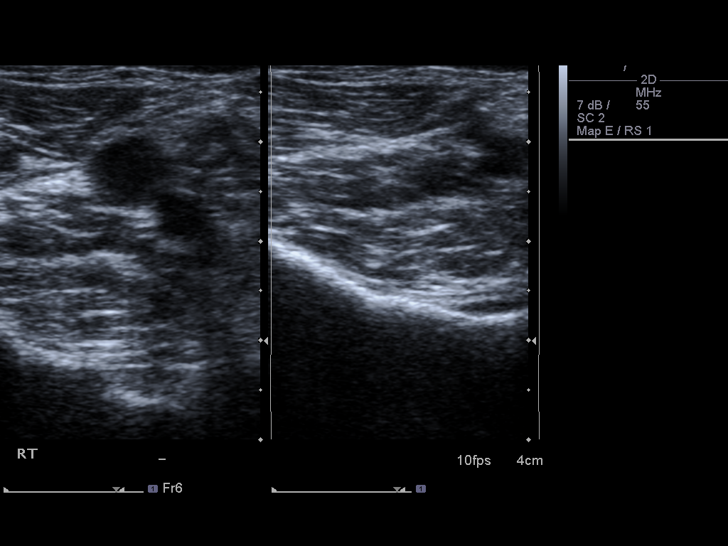
[im 8/27]
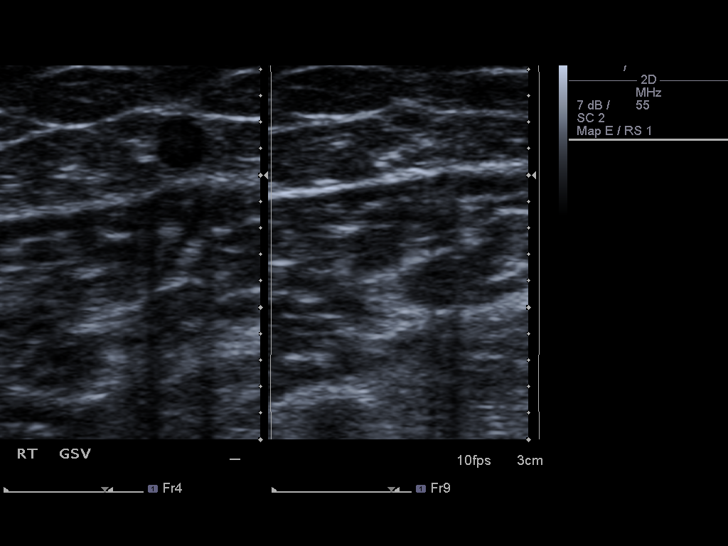
[im 11/27]
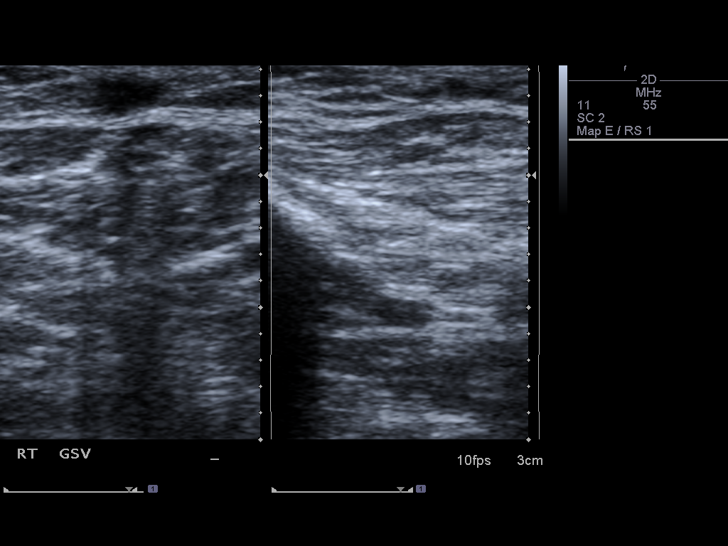
[im 13/27]
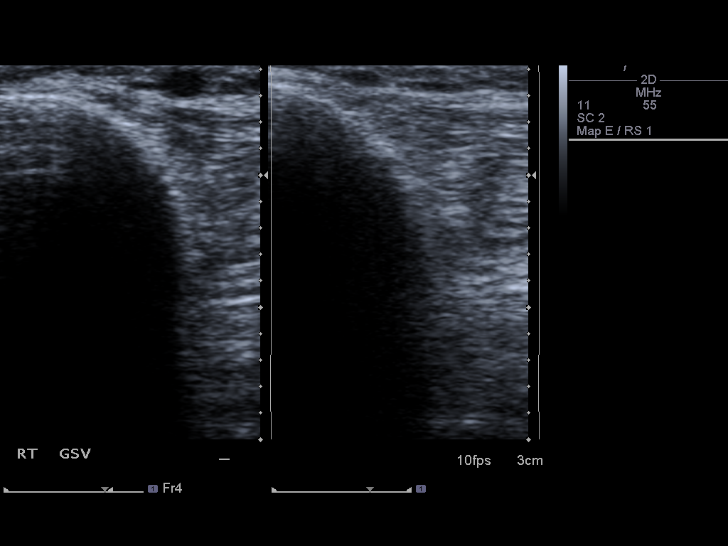
[im 14/27]
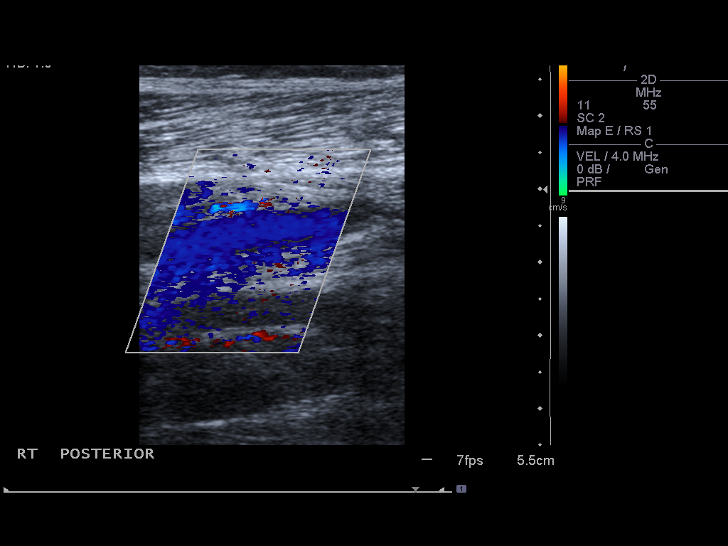
[im 16/27]
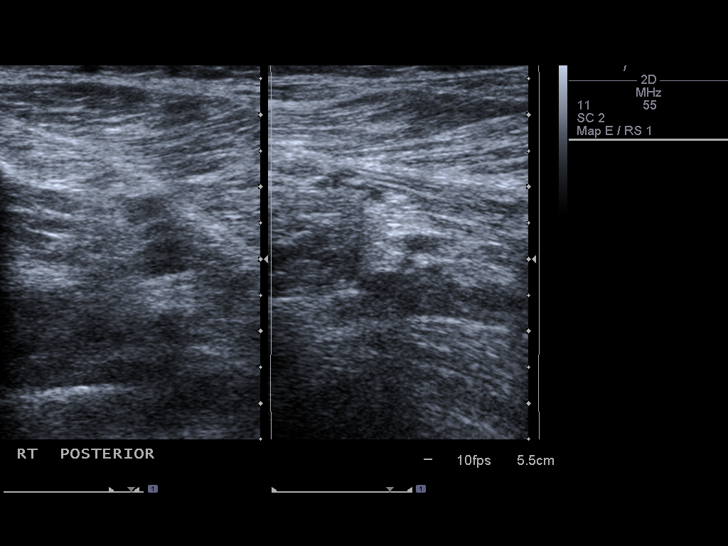
[im 19/27]
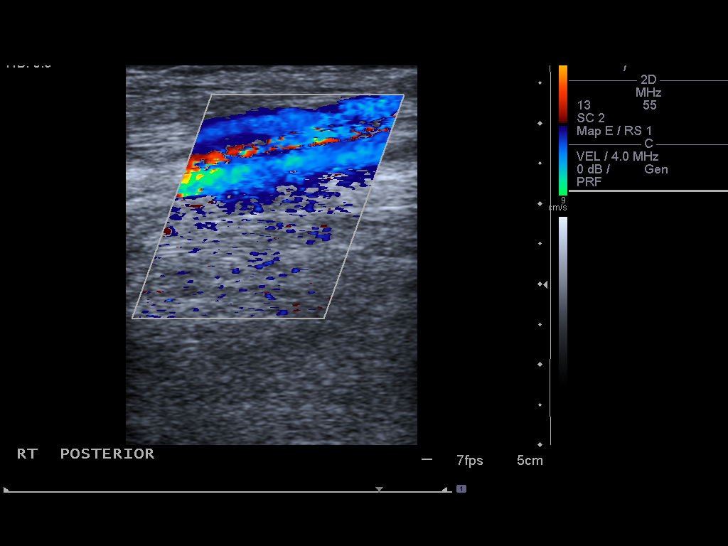
[im 21/27]
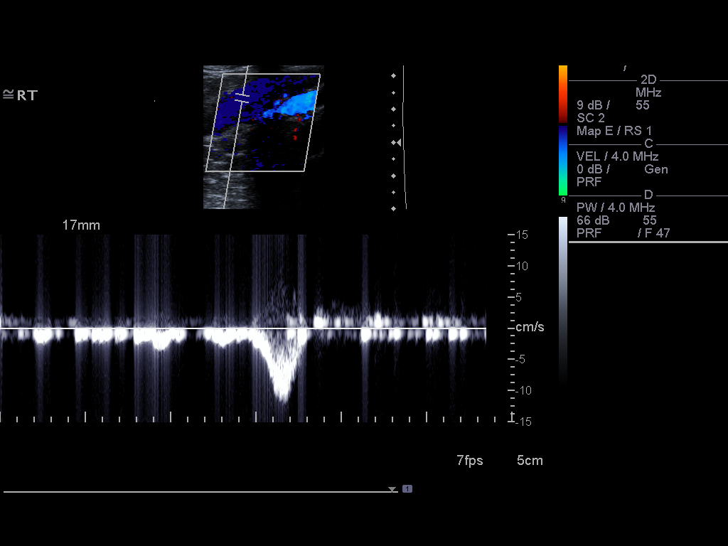
[im 22/27]
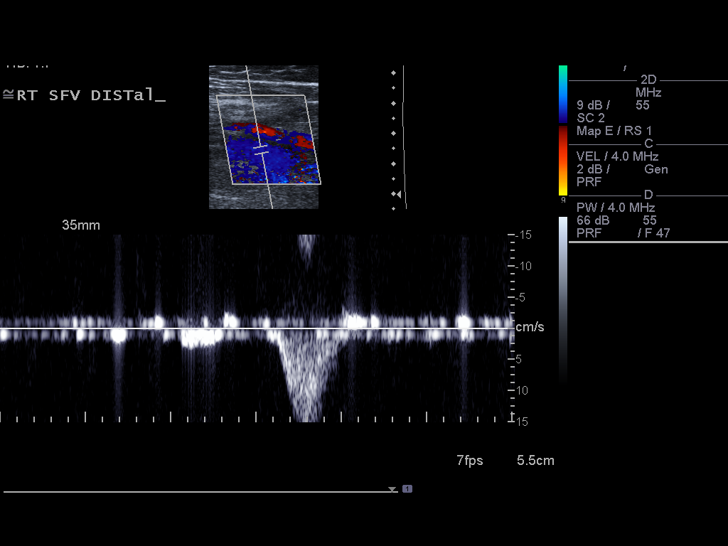
[im 24/27]
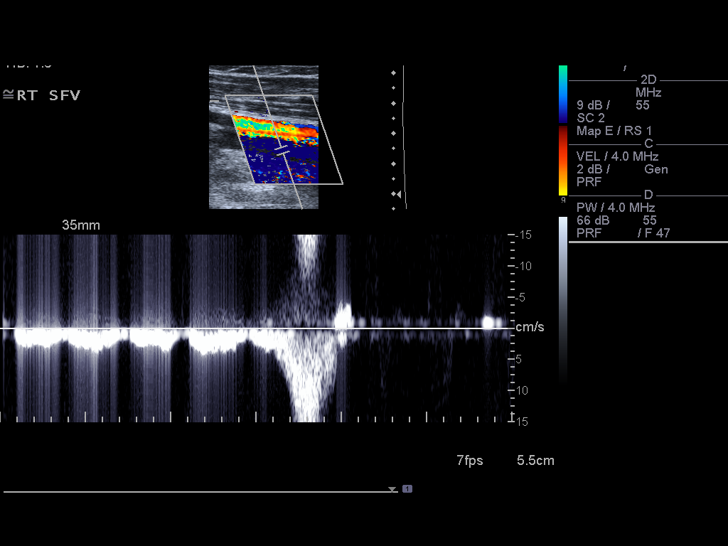
[im 27/27]
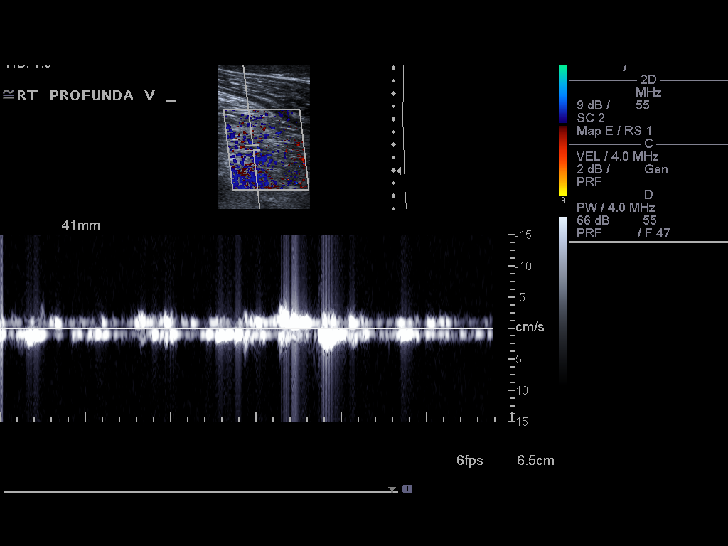

[14 of 24 positions shown; findings below may reference images not displayed]

FINDINGS: The visualized right lower extremity deep venous system
appears patent.

Normal compressibility.  Patent color Doppler flow.  Satisfactory
spectral Doppler with respiratory variation and response to
augmentation.

The greater saphenous vein, where visualized, demonstrates color
Doppler flow and is compressible.
IMPRESSION: No deep venous thrombosis in the visualized right lower extremity.

## 2013-05-15 ENCOUNTER — Encounter (HOSPITAL_BASED_OUTPATIENT_CLINIC_OR_DEPARTMENT_OTHER): Attending: General Surgery

## 2013-05-15 DIAGNOSIS — L8993 Pressure ulcer of unspecified site, stage 3: Secondary | ICD-10-CM | POA: Insufficient documentation

## 2013-05-15 DIAGNOSIS — Q909 Down syndrome, unspecified: Secondary | ICD-10-CM | POA: Insufficient documentation

## 2013-05-15 DIAGNOSIS — F0390 Unspecified dementia without behavioral disturbance: Secondary | ICD-10-CM | POA: Insufficient documentation

## 2013-05-15 DIAGNOSIS — Z79899 Other long term (current) drug therapy: Secondary | ICD-10-CM | POA: Insufficient documentation

## 2013-05-15 DIAGNOSIS — E039 Hypothyroidism, unspecified: Secondary | ICD-10-CM | POA: Insufficient documentation

## 2013-05-15 DIAGNOSIS — L89309 Pressure ulcer of unspecified buttock, unspecified stage: Secondary | ICD-10-CM | POA: Insufficient documentation

## 2013-05-16 NOTE — Progress Notes (Signed)
[facility]  NAME:  [name], [name]              ACCOUNT NO.:  [id number]  MEDICAL RECORD NO.:  [id number]      DATE OF BIRTH:  [birth year]  PHYSICIAN:  Ardath Sax, M.D.           VISIT DATE:                                  OFFICE VISIT   J[facility] as a 52 year old man with Down syndrome and dementia, hypothyroidism, and it looks like he has probably had strokes.  He is only on Keppra and Synthroid. He was sent here because he has a pressure ulcer on his right ischium. It is about 2 cm in diameter.  I debrided it of necrotic material down to the subcu and elected to treat it with silver alginate and Santyl, and the brother is very adapt to taking care of him, and he will do the treatments himself.  The cause of this of course is pressure because he lies on his hips all the time.  I encouraged the brother to keep trying to make sure that he rotates from side to side and on his back and if possible on his abdomen to keep the weight off the ischium.  He is going to come back in a month and he will change the dressings himself. Another thing that could help this gentleman is a air mattress, so we will attempt to see if we can get him an air mattress.  So, his diagnosis is, 1. Pressure ulcer, stage III, right ischium. 2. Down syndrome. 3. Dementia. 4. Hypothyroidism.     Ardath Sax, M.D.     PP/MEDQ  D:  05/15/2013  T:  05/16/2013  Job:  [id number]

## 2013-06-12 ENCOUNTER — Encounter (HOSPITAL_BASED_OUTPATIENT_CLINIC_OR_DEPARTMENT_OTHER): Attending: General Surgery

## 2013-06-12 DIAGNOSIS — L8992 Pressure ulcer of unspecified site, stage 2: Secondary | ICD-10-CM | POA: Insufficient documentation

## 2013-06-12 DIAGNOSIS — L89309 Pressure ulcer of unspecified buttock, unspecified stage: Secondary | ICD-10-CM | POA: Insufficient documentation

## 2013-07-03 ENCOUNTER — Encounter (HOSPITAL_BASED_OUTPATIENT_CLINIC_OR_DEPARTMENT_OTHER): Payer: Medicare Other | Attending: General Surgery

## 2013-07-30 DEATH — deceased

## 2013-08-17 ENCOUNTER — Telehealth: Payer: Self-pay

## 2013-08-17 NOTE — Telephone Encounter (Signed)
Patient past away @ [facility] per [facility] in [place] [employer] °
